# Patient Record
Sex: Female | Born: 2000 | Race: White | Hispanic: No | Marital: Single | Smoking: Never smoker
Health system: Southern US, Community
[De-identification: ages and names within clinical notes are randomized; demographics above are authoritative.]

---

## 2018-01-23 ENCOUNTER — Encounter: Payer: Self-pay | Admitting: Emergency Medicine

## 2018-01-23 ENCOUNTER — Other Ambulatory Visit: Payer: Self-pay

## 2018-01-23 ENCOUNTER — Emergency Department
Admission: EM | Admit: 2018-01-23 | Discharge: 2018-01-23 | Disposition: A | Discharge status: Home / Self Care | Payer: 59 | Attending: Emergency Medicine | Admitting: Emergency Medicine

## 2018-01-23 DIAGNOSIS — T7805XA Anaphylactic reaction due to tree nuts and seeds, initial encounter: Secondary | ICD-10-CM | POA: Insufficient documentation

## 2018-01-23 DIAGNOSIS — T782XXA Anaphylactic shock, unspecified, initial encounter: Secondary | ICD-10-CM

## 2018-01-23 DIAGNOSIS — T7840XA Allergy, unspecified, initial encounter: Secondary | ICD-10-CM | POA: Diagnosis present

## 2018-01-23 MED ORDER — PREDNISONE 20 MG PO TABS: 60.0000 mg | ORAL_TABLET | Freq: Every day | ORAL | 0 refills | Status: DC

## 2018-01-23 MED ORDER — DIPHENHYDRAMINE HCL 50 MG/ML IJ SOLN
25.0000 mg | Freq: Once | INTRAMUSCULAR | Status: AC
  Administered 2018-01-23: 25 mg via INTRAVENOUS
  Filled 2018-01-23: qty 1

## 2018-01-23 MED ORDER — EPINEPHRINE 0.3 MG/0.3ML IJ SOAJ: 0.3000 mg | Freq: Once | INTRAMUSCULAR | 0 refills | Status: AC

## 2018-01-23 MED ORDER — PREDNISONE 20 MG PO TABS
60.0000 mg | ORAL_TABLET | Freq: Once | ORAL | Status: AC
  Administered 2018-01-23: 60 mg via ORAL
  Filled 2018-01-23: qty 3

## 2018-01-23 MED ORDER — FAMOTIDINE IN NACL 20-0.9 MG/50ML-% IV SOLN
20.0000 mg | Freq: Once | INTRAVENOUS | Status: AC
  Administered 2018-01-23: 20 mg via INTRAVENOUS
  Filled 2018-01-23: qty 50

## 2018-01-23 MED ORDER — FAMOTIDINE 20 MG PO TABS: 20.0000 mg | ORAL_TABLET | Freq: Two times a day (BID) | ORAL | 0 refills | Status: AC

## 2018-01-23 NOTE — ED Provider Notes (Addendum)
[facility] Emergency Department Provider Note  ____________________________________________   I have reviewed the triage vital signs and the nursing notes. Where available I have reviewed prior notes and, if possible and indicated, outside hospital notes.    HISTORY  Chief Complaint Allergic Reaction    HPI Shannon Tran is a 17 y.o. 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  Did feel itching in her throat and tongue, and she used her EpiPen, and her symptoms have now resolved.  S she has used her EpiPen before.  It has happened at least once in the past where she had allergic reactions recur after the EpiPen.  She denies any symptoms at this time she states her tongue feels very slightly itchy but she has no angioedema she has no shortness of breath she has no wheeze or hives     History reviewed. No pertinent past medical history.  There are no active problems to display for this patient.   History reviewed. No pertinent surgical history.  Prior to Admission medications   Not on File    Allergies Other and Sesame seed (diagnostic)  No family history on file.  Social History Social History   Tobacco Use  . Smoking status: Not on file  Substance Use Topics  . Alcohol use: Not on file  . Drug use: Not on file    Review of Systems Constitutional: No fever/chills Eyes: No visual changes. ENT: No sore throat. No stiff neck no neck pain Cardiovascular: Denies chest pain. Respiratory: Denies shortness of breath. Gastrointestinal:   no vomiting.  No diarrhea.  No constipation. Genitourinary: Negative for dysuria. Musculoskeletal: Negative lower extremity swelling Skin: Negative for rash. Neurological: Negative for severe headaches, focal weakness or numbness.   ____________________________________________   PHYSICAL EXAM:  VITAL SIGNS: ED  Triage Vitals  Enc Vitals Group     BP 01/23/18 1259 119/60     Pulse Rate 01/23/18 1259 70     Resp 01/23/18 1259 18     Temp 01/23/18 1259 (!) 97.5 F (36.4 C)     Temp Source 01/23/18 1259 Oral     SpO2 01/23/18 1259 98 %     Weight 01/23/18 1300 130 lb (59 kg)     Height 01/23/18 1300 5\' 7"  (1.702 m)     Head Circumference --      Peak Flow --      Pain Score 01/23/18 1300 0     Pain Loc --      Pain Edu? --      Excl. in GC? --     Constitutional: Alert and oriented. Well appearing and in no acute distress. Eyes: Conjunctivae are normal Head: Atraumatic HEENT: No congestion/rhinnorhea. Mucous membranes are moist.  Oropharynx non-erythematous Neck:   Nontender with no meningismus, no masses, no stridor Cardiovascular: Normal rate, regular rhythm. Grossly normal heart sounds.  Good peripheral circulation. Respiratory: Normal respiratory effort.  No retractions. Lungs CTAB. Abdominal: Soft and nontender. No distention. No guarding no rebound Back:  There is no focal tenderness or step off.  there is no midline tenderness there are no lesions noted. there is no CVA tenderness Musculoskeletal: No lower extremity tenderness, no upper extremity tenderness. No joint effusions, no DVT signs strong distal pulses no edema Neurologic:  Normal speech and language. No gross focal neurologic deficits are appreciated.  Skin:  Skin is warm,  dry and intact. No rash noted. Psychiatric: Mood and affect are normal. Speech and behavior are normal.  ____________________________________________   LABS (all labs ordered are listed, but only abnormal results are displayed)  Labs Reviewed - No data to display  Pertinent labs  results that were available during my care of the patient were reviewed by me and considered in my medical decision making (see chart for details). ____________________________________________  EKG  I personally interpreted any EKGs ordered by me or  triage  ____________________________________________  RADIOLOGY  Pertinent labs & imaging results that were available during my care of the patient were reviewed by me and considered in my medical decision making (see chart for details). If possible, patient and/or family made aware of any abnormal findings.  No results found. ____________________________________________    PROCEDURES  Procedure(s) performed: None  Procedures  Critical Care performed: None  ____________________________________________   INITIAL IMPRESSION / ASSESSMENT AND PLAN / ED COURSE  Pertinent labs & imaging results that were available during my care of the patient were reviewed by me and considered in my medical decision making (see chart for details).  Patient had an allergic reaction to sesame seed, took an EpiPen and now feels better.  There is no evidence of angioedema to the lips tongue uvula or posterior pharynx, her throat is normal to her subjective assessment and she has no evidence of throat swelling or stridor at this time.  Lungs are clear, no evidence of hives.  She is appears to be almost completely resolved after her EpiPen we will nonetheless give her Benadryl, prednisone, and Pepcid we will keep her in the emergency room for several hours to ensure that she does not have a recurrence of her allergic symptom allergy after the depletion of her epinephrine.  ----------------------------------------- 3:23 PM on 01/23/2018 ----------------------------------------- Signed out to Dr. Don Perking at the end of my shift     ____________________________________________   FINAL CLINICAL IMPRESSION(S) / ED DIAGNOSES  Final diagnoses:  None      This chart was dictated using voice recognition software.  Despite best efforts to proofread,  errors can occur which can change meaning.      Jeanmarie Plant, MD 01/23/18 1416    Jeanmarie Plant, MD 01/23/18 1535

## 2018-01-23 NOTE — Discharge Instructions (Addendum)
You did an excellent job using your EpiPen today.  Keep it with you at all times.  If you have a recurrence of symptoms that are significant such as you had today, use the EpiPen and call 911.  If you have a mild reaction such as isolated mild hives, take Benadryl and if it does not resolve return to the emergency room.  If you have any trouble breathing call 911 immediately and use your EpiPen.

## 2018-01-23 NOTE — ED Provider Notes (Signed)
-----------------------------------------   4:56 PM on 01/23/2018 -----------------------------------------   Blood pressure 104/65, pulse 74, temperature (!) 97.5 F (36.4 C), temperature source Oral, resp. rate 18, height 5\' 7"  (1.702 m), weight 59 kg, last menstrual period 01/09/2018, SpO2 98 %.  Assuming care from Dr. Alphonzo Lemmings of Shannon Tran is a 17 y.o. female with a chief complaint of Allergic Reaction .    Please refer to H&P by previous MD for further details.  Patient was observed for 4 hours in the emergency department with no recurrence of her anaphylactic reaction.  Patient looks extremely well-appearing at this time, eating chips and talking and laughing with a friend.  She does have epi-pens at home in case her symptoms recur.  We reinforced the importance of using the EpiPen for anaphylactic symptoms.  She will be discharged home with prescriptions and instructions left by Dr. Alphonzo Lemmings.Don Perking, [place], MD 01/23/18 [phone]

## 2018-01-23 NOTE — ED Notes (Signed)
E signature pad not working on [employer]. Pt verbalizes understanding of discharge and follow up instructions as well as prescriptions.

## 2018-01-23 NOTE — ED Triage Notes (Signed)
States ate something with sesame seeds approx 1215, is allergic. Took epi pen at 1230 due to feeling like throat closing no resp diatress at present.

## 2018-01-23 NOTE — ED Notes (Signed)
Report given to Lorrie RN 

## 2018-02-25 ENCOUNTER — Encounter: Payer: Self-pay | Admitting: Emergency Medicine

## 2018-02-25 ENCOUNTER — Emergency Department: Payer: 59

## 2018-02-25 ENCOUNTER — Other Ambulatory Visit: Payer: Self-pay

## 2018-02-25 ENCOUNTER — Emergency Department
Admission: EM | Admit: 2018-02-25 | Discharge: 2018-02-25 | Disposition: A | Discharge status: Home / Self Care | Payer: 59 | Attending: Emergency Medicine | Admitting: Emergency Medicine

## 2018-02-25 DIAGNOSIS — R55 Syncope and collapse: Secondary | ICD-10-CM

## 2018-02-25 LAB — BASIC METABOLIC PANEL
Anion gap: 8 (ref 5–15)
BUN: 10 mg/dL (ref 6–20)
CO2: 27 mmol/L (ref 22–32)
CREATININE: 0.75 mg/dL (ref 0.44–1.00)
Calcium: 9.6 mg/dL (ref 8.9–10.3)
Chloride: 103 mmol/L (ref 98–111)
GFR calc Af Amer: >60 mL/min (ref >60)
GLUCOSE: 95 mg/dL (ref 70–99)
Potassium: 3.9 mmol/L (ref 3.5–5.1)
SODIUM: 138 mmol/L (ref 135–145)

## 2018-02-25 LAB — URINALYSIS, COMPLETE (UACMP) WITH MICROSCOPIC
BACTERIA UA: NONE SEEN
Bilirubin Urine: NEGATIVE
Glucose, UA: NEGATIVE mg/dL
HGB URINE DIPSTICK: NEGATIVE
Ketones, ur: NEGATIVE mg/dL
Leukocytes, UA: NEGATIVE
NITRITE: NEGATIVE
PH: 7 (ref 5.0–8.0)
Protein, ur: NEGATIVE mg/dL
SPECIFIC GRAVITY, URINE: 1.003 — AB (ref 1.005–1.030)

## 2018-02-25 LAB — CBC
HCT: 39.4 % (ref 36.0–46.0)
Hemoglobin: 13.1 g/dL (ref 12.0–15.0)
MCH: 30.8 pg (ref 26.0–34.0)
MCHC: 33.2 g/dL (ref 30.0–36.0)
MCV: 92.7 fL (ref 80.0–100.0)
Platelets: 313 10*3/uL (ref 150–400)
RBC: 4.25 MIL/uL (ref 3.87–5.11)
RDW: 12.2 % (ref 11.5–15.5)
WBC: 8.1 10*3/uL (ref 4.0–10.5)
nRBC: 0 % (ref 0.0–0.2)

## 2018-02-25 LAB — PREGNANCY, URINE: PREG TEST UR: NEGATIVE

## 2018-02-25 LAB — TROPONIN I: Troponin I: <0.03 ng/mL (ref <0.03)

## 2018-02-25 MED ORDER — SODIUM CHLORIDE 0.9 % IV SOLN
Freq: Once | INTRAVENOUS | Status: AC
  Administered 2018-02-25: 21:00:00 via INTRAVENOUS

## 2018-02-25 MED ORDER — SODIUM CHLORIDE 0.9 % IV SOLN
Freq: Once | INTRAVENOUS | Status: AC
  Administered 2018-02-25: 22:00:00 via INTRAVENOUS

## 2018-02-25 NOTE — ED Notes (Signed)
Patient states that she feels like she is going to pass out. Patient became diaphoretic. Blood pressure 71/49 and heart rate 109.

## 2018-02-25 NOTE — ED Provider Notes (Addendum)
[facility] Emergency Department Provider Note       Time seen: ----------------------------------------- 8:05 PM on 02/25/2018 -----------------------------------------   I have reviewed the triage vital signs and the nursing notes.  HISTORY   Chief Complaint Loss of Consciousness    HPI Shannon Tran is a 17 y.o. female with no significant past medical history who presents to the ED for syncope.  Patient states she feels like she can a pass out, she became diaphoretic, heart rate increased and her blood pressure dropped.  She is a Landscape architect.  Patient has no history of syncope in the past, is currently on amoxicillin for a sore throat but also tested positive for mono.  History reviewed. No pertinent past medical history.  There are no active problems to display for this patient.   History reviewed. No pertinent surgical history.  Allergies Other and Sesame seed (diagnostic)  Social History Social History   Tobacco Use  . Smoking status: Never Smoker  . Smokeless tobacco: Never Used  Substance Use Topics  . Alcohol use: Yes  . Drug use: Never   Review of Systems Constitutional: Negative for fever. ENT: Positive for sore throat Cardiovascular: Negative for chest pain. Respiratory: Positive for shortness of breath Gastrointestinal: Negative for abdominal pain, vomiting and diarrhea. Musculoskeletal: Negative for back pain. Skin: Negative for rash. Neurological: Negative for headaches, positive for weakness  All systems negative/normal/unremarkable except as stated in the HPI  ____________________________________________   PHYSICAL EXAM:  VITAL SIGNS: ED Triage Vitals  Enc Vitals Group     BP 02/25/18 1956 114/86     Pulse Rate 02/25/18 1956 83     Resp 02/25/18 1956 18     Temp 02/25/18 1956 98.4 F (36.9 C)     Temp Source 02/25/18 1956 Oral     SpO2 02/25/18 1956 98 %     Weight 02/25/18 1952 130 lb (59 kg)     Height  02/25/18 1952 5\' 7"  (1.702 m)     Head Circumference --      Peak Flow --      Pain Score 02/25/18 1952 0     Pain Loc --      Pain Edu? --      Excl. in GC? --    Constitutional: Alert and oriented. Well appearing and in no distress. Eyes: Conjunctivae are normal. Normal extraocular movements. ENT   Head: Normocephalic and atraumatic.   Nose: No congestion/rhinnorhea.   Mouth/Throat: Mucous membranes are moist.   Neck: No stridor. Cardiovascular: Normal rate, regular rhythm. No murmurs, rubs, or gallops. Respiratory: Normal respiratory effort without tachypnea nor retractions. Breath sounds are clear and equal bilaterally. No wheezes/rales/rhonchi. Gastrointestinal: Soft and nontender. Normal bowel sounds Musculoskeletal: Nontender with normal range of motion in extremities. No lower extremity tenderness nor edema. Neurologic:  Normal speech and language. No gross focal neurologic deficits are appreciated.  Skin:  Skin is warm, dry and intact. No rash noted. Psychiatric: Mood and affect are normal. Speech and behavior are normal.  ____________________________________________  EKG: Interpreted by me.  Sinus rhythm with a rate of 73 bpm, low voltage QRS, normal axis, normal QT  ____________________________________________  ED COURSE:  As part of my medical decision making, I reviewed the following data within the electronic MEDICAL RECORD NUMBER History obtained from family if available, nursing notes, old chart and ekg, as well as notes from prior ED visits. Patient presented for recurrent episodes of syncope, we will assess with labs and imaging  as indicated at this time.   Procedures ____________________________________________   LABS (pertinent positives/negatives)  Labs Reviewed  URINALYSIS, COMPLETE (UACMP) WITH MICROSCOPIC - Abnormal; Notable for the following components:      Result Value   Color, Urine STRAW (*)    APPearance CLEAR (*)    Specific Gravity,  Urine 1.003 (*)    All other components within normal limits  BASIC METABOLIC PANEL  CBC  TROPONIN I  PREGNANCY, URINE  CBG MONITORING, ED  POC URINE PREG, ED    RADIOLOGY Images were viewed by me  Chest x-ray Is unremarkable ____________________________________________  DIFFERENTIAL DIAGNOSIS   Dehydration, electrolyte abnormality, arrhythmia, orthostatic hypotension, mono  FINAL ASSESSMENT AND PLAN  Syncope   Plan: The patient had presented for syncope and was found to be hypotensive here. Patient's labs are unremarkable. Patient's imaging is also unremarkable.  She appeared to have a vasovagal type near syncopal event when her blood was being drawn but otherwise has been normal.  We gave her 2 L of fluid and she has not had orthostatic changes upon standing after that.  She still states she feels dizzy and I am unclear as to why this would be, she did test positive for mono so that may be why she feels poorly.  She is also currently taking amoxicillin but I am unclear as to why.  She was negative for mono and strep.  I will advise that she stop her amoxicillin.   Ulice Dash, MD   Note: This note was generated in part or whole with voice recognition software. Voice recognition is usually quite accurate but there are transcription errors that can and very often do occur. I apologize for any typographical errors that were not detected and corrected.     Emily Filbert, MD 02/25/18 2316    Emily Filbert, MD 02/25/18 [phone]

## 2018-02-25 NOTE — ED Notes (Signed)
Patient ambulated without difficulty to bathroom

## 2018-02-25 NOTE — ED Notes (Signed)
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

## 2018-02-25 NOTE — ED Notes (Signed)
Patient verbalized understanding of discharge instructions, no questions. Patient ambulated out of ED with steady gait in no distress.  

## 2018-02-25 NOTE — ED Triage Notes (Signed)
Patient states that she has felt dizzy times 24 hours. Patient states that she has had 4 syncopal episodes today.

## 2018-02-25 NOTE — ED Notes (Signed)
Upon ambulation down hall, patient reports dizziness. Dr. Mayford Knife aware.

## 2018-02-25 NOTE — ED Notes (Signed)
POC preg negative.

## 2019-02-08 ENCOUNTER — Other Ambulatory Visit: Payer: Self-pay

## 2019-02-08 DIAGNOSIS — Z20822 Contact with and (suspected) exposure to covid-19: Secondary | ICD-10-CM

## 2019-02-10 LAB — NOVEL CORONAVIRUS, NAA: SARS-CoV-2, NAA: NOT DETECTED

## 2019-02-20 ENCOUNTER — Other Ambulatory Visit: Payer: Self-pay

## 2019-02-20 DIAGNOSIS — Z20822 Contact with and (suspected) exposure to covid-19: Secondary | ICD-10-CM

## 2019-02-22 LAB — NOVEL CORONAVIRUS, NAA: SARS-CoV-2, NAA: NOT DETECTED

## 2019-03-07 ENCOUNTER — Encounter: Payer: Self-pay | Admitting: *Deleted

## 2019-03-07 ENCOUNTER — Ambulatory Visit
Admission: EM | Admit: 2019-03-07 | Discharge: 2019-03-07 | Disposition: A | Discharge status: Home / Self Care | Payer: BC Managed Care – PPO | Attending: Emergency Medicine | Admitting: Emergency Medicine

## 2019-03-07 DIAGNOSIS — J039 Acute tonsillitis, unspecified: Secondary | ICD-10-CM | POA: Insufficient documentation

## 2019-03-07 LAB — POCT RAPID STREP A (OFFICE): Rapid Strep A Screen: NEGATIVE

## 2019-03-07 MED ORDER — AMOXICILLIN 500 MG PO TABS: 500.0000 mg | ORAL_TABLET | Freq: Three times a day (TID) | ORAL | 0 refills | Status: AC

## 2019-03-07 NOTE — ED Triage Notes (Signed)
Patient reports sore throat since Friday, with increase swelling and "white spots" now on throat since yesterday. No fever or body aches.

## 2019-03-07 NOTE — ED Provider Notes (Signed)
[facility]    CSN: [id number] Arrival date & time: 03/07/19  1133      History   Chief Complaint Chief Complaint  Patient presents with  . Sore Throat    HPI Shannon Tran is a 18 y.o. female.   Patient presents with sore throat times next days.  She also reports "white spots" on her throat x1 day.  She denies fever, chills, rash, difficulty swallowing, cough, shortness of breath, or other symptoms.  LMP: 03/05/2019.  Treatment attempted at home with Tylenol and ibuprofen.  The history is provided by the patient.    History reviewed. No pertinent past medical history.  There are no active problems to display for this patient.   History reviewed. No pertinent surgical history.  OB History   No obstetric history on file.      Home Medications    Prior to Admission medications   Medication Sig Start Date End Date Taking? Authorizing Provider  amoxicillin (AMOXIL) 500 MG tablet Take 1 tablet (500 mg total) by mouth 3 (three) times daily. 03/07/19   Sharion Balloon, NP  famotidine (PEPCID) 20 MG tablet Take 1 tablet (20 mg total) by mouth 2 (two) times daily for 5 days. 01/23/18 01/28/18  Schuyler Amor, MD  predniSONE (DELTASONE) 20 MG tablet Take 3 tablets (60 mg total) by mouth daily. 01/23/18   Schuyler Amor, MD    Family History History reviewed. No pertinent family history.  Social History Social History   Tobacco Use  . Smoking status: Never Smoker  . Smokeless tobacco: Never Used  Substance Use Topics  . Alcohol use: Yes    Comment: occ  . Drug use: Never     Allergies   Other and Sesame seed (diagnostic)   Review of Systems Review of Systems  Constitutional: Negative for chills and fever.  HENT: Positive for sore throat. Negative for congestion, ear pain, rhinorrhea and trouble swallowing.   Eyes: Negative for pain and visual disturbance.  Respiratory: Negative for cough and shortness of breath.   Cardiovascular: Negative for chest  pain and palpitations.  Gastrointestinal: Negative for abdominal pain, diarrhea and vomiting.  Genitourinary: Negative for dysuria and hematuria.  Musculoskeletal: Negative for arthralgias and back pain.  Skin: Negative for color change and rash.  Neurological: Negative for seizures and syncope.  All other systems reviewed and are negative.    Physical Exam Triage Vital Signs ED Triage Vitals  Enc Vitals Group     BP 03/07/19 1136 113/76     Pulse Rate 03/07/19 1136 (!) 53     Resp 03/07/19 1136 16     Temp 03/07/19 1136 97.9 F (36.6 C)     Temp Source 03/07/19 1136 Oral     SpO2 03/07/19 1136 99 %     Weight --      Height --      Head Circumference --      Peak Flow --      Pain Score 03/07/19 1134 8     Pain Loc --      Pain Edu? --      Excl. in [place]? --    No data found.  Updated Vital Signs BP 113/76 (BP Location: Left Arm)   Pulse (!) 53   Temp 97.9 F (36.6 C) (Oral)   Resp 16   LMP 03/05/2019   SpO2 99%   Visual Acuity Right Eye Distance:   Left Eye Distance:   Bilateral Distance:  Right Eye Near:   Left Eye Near:    Bilateral Near:     Physical Exam Vitals signs and nursing note reviewed.  Constitutional:      General: She is not in acute distress.    Appearance: She is well-developed.  HENT:     Head: Normocephalic and atraumatic.     Right Ear: Tympanic membrane normal.     Left Ear: Tympanic membrane normal.     Nose: Nose normal.     Mouth/Throat:     Mouth: Mucous membranes are moist.     Pharynx: Uvula midline. Posterior oropharyngeal erythema present.     Tonsils: 2+ on the right. 2+ on the left.  Eyes:     Conjunctiva/sclera: Conjunctivae normal.  Neck:     Musculoskeletal: Neck supple.  Cardiovascular:     Rate and Rhythm: Normal rate and regular rhythm.     Heart sounds: No murmur.  Pulmonary:     Effort: Pulmonary effort is normal. No respiratory distress.     Breath sounds: Normal breath sounds.  Abdominal:     General:  Bowel sounds are normal.     Palpations: Abdomen is soft.     Tenderness: There is no abdominal tenderness. There is no guarding or rebound.  Skin:    General: Skin is warm and dry.     Findings: No rash.  Neurological:     Mental Status: She is alert.      UC Treatments / Results  Labs (all labs ordered are listed, but only abnormal results are displayed) Labs Reviewed  POCT RAPID STREP A (OFFICE)    EKG   Radiology No results found.  Procedures Procedures (including critical care time)  Medications Ordered in UC Medications - No data to display  Initial Impression / Assessment and Plan / UC Course  I have reviewed the triage vital signs and the nursing notes.  Pertinent labs & imaging results that were available during my care of the patient were reviewed by me and considered in my medical decision making (see chart for details).    Acute tonsillitis.  Treating with amoxicillin.  Instructed patient to take Tylenol or ibuprofen as needed for discomfort.  Instructed her to return here or follow-up with her PCP if her symptoms are not improving.  Patient agrees to plan of care.     Final Clinical Impressions(s) / UC Diagnoses   Final diagnoses:  Acute tonsillitis, unspecified etiology     Discharge Instructions     Take the amoxicillin 3 times a day for 7 days.    Take Tylenol or ibuprofen as needed for discomfort.   Follow-up with your primary care provider or return here if your symptoms or not improving.        ED Prescriptions    Medication Sig Dispense Auth. Provider   amoxicillin (AMOXIL) 500 MG tablet Take 1 tablet (500 mg total) by mouth 3 (three) times daily. 21 tablet Mickie Bail, NP     PDMP not reviewed this encounter.   Mickie Bail, NP 03/07/19 [phone]

## 2019-03-07 NOTE — Discharge Instructions (Addendum)
Take the amoxicillin 3 times a day for 7 days.    Take Tylenol or ibuprofen as needed for discomfort.   Follow-up with your primary care provider or return here if your symptoms or not improving.

## 2019-03-10 LAB — CULTURE, GROUP A STREP (THRC)

## 2019-06-13 ENCOUNTER — Other Ambulatory Visit: Payer: BC Managed Care – PPO

## 2019-08-25 ENCOUNTER — Encounter: Payer: Self-pay | Admitting: Emergency Medicine

## 2019-08-25 ENCOUNTER — Other Ambulatory Visit: Payer: Self-pay

## 2019-08-25 DIAGNOSIS — Z5321 Procedure and treatment not carried out due to patient leaving prior to being seen by health care provider: Secondary | ICD-10-CM | POA: Diagnosis not present

## 2019-08-25 DIAGNOSIS — F10920 Alcohol use, unspecified with intoxication, uncomplicated: Secondary | ICD-10-CM | POA: Insufficient documentation

## 2019-08-25 LAB — COMPREHENSIVE METABOLIC PANEL
ALT: 17 U/L (ref 0–44)
AST: 25 U/L (ref 15–41)
Albumin: 5 g/dL (ref 3.5–5.0)
Alkaline Phosphatase: 94 U/L (ref 38–126)
Anion gap: 8 (ref 5–15)
BUN: 10 mg/dL (ref 6–20)
CO2: 25 mmol/L (ref 22–32)
Calcium: 9 mg/dL (ref 8.9–10.3)
Chloride: 110 mmol/L (ref 98–111)
Creatinine, Ser: 0.68 mg/dL (ref 0.44–1.00)
GFR calc Af Amer: >60 mL/min (ref >60)
GFR calc non Af Amer: >60 mL/min (ref >60)
Glucose, Bld: 93 mg/dL (ref 70–99)
Potassium: 3.8 mmol/L (ref 3.5–5.1)
Sodium: 143 mmol/L (ref 135–145)
Total Bilirubin: 0.7 mg/dL (ref 0.3–1.2)
Total Protein: 8.1 g/dL (ref 6.5–8.1)

## 2019-08-25 LAB — CBC
HCT: 38 % (ref 36.0–46.0)
Hemoglobin: 13 g/dL (ref 12.0–15.0)
MCH: 31.4 pg (ref 26.0–34.0)
MCHC: 34.2 g/dL (ref 30.0–36.0)
MCV: 91.8 fL (ref 80.0–100.0)
Platelets: 306 10*3/uL (ref 150–400)
RBC: 4.14 MIL/uL (ref 3.87–5.11)
RDW: 11.7 % (ref 11.5–15.5)
WBC: 8.3 10*3/uL (ref 4.0–10.5)
nRBC: 0 % (ref 0.0–0.2)

## 2019-08-25 LAB — ETHANOL: Alcohol, Ethyl (B): 285 mg/dL — ABNORMAL HIGH (ref <10)

## 2019-08-25 NOTE — ED Triage Notes (Signed)
Pt arrives POV to triage with c/o being intoxicated. Pt has no idea how much she drank but was found sleeping on the sidewalk and not answering her friends. Pt laid herself down and has had no injury or trauma. Pt is giggling in triage at this time and is in NAD.

## 2019-08-25 NOTE — ED Notes (Signed)
Patient observed walking out with friend by registration clerk.

## 2019-08-26 ENCOUNTER — Emergency Department
Admission: EM | Admit: 2019-08-26 | Discharge: 2019-08-26 | Disposition: A | Discharge status: Home / Self Care | Payer: BC Managed Care – PPO | Attending: Emergency Medicine | Admitting: Emergency Medicine

## 2019-08-27 ENCOUNTER — Telehealth: Payer: Self-pay | Admitting: Emergency Medicine

## 2019-08-27 NOTE — Telephone Encounter (Signed)
Called patient due to lwot to inquire about condition and follow up plans. Left message.   

## 2019-11-14 IMAGING — CR DG CHEST 2V
1 series · 3 of 3 positions shown · non-contrast
Comparison: None.

CLINICAL DATA: Shortness of breath

EXAM:
CHEST - 2 VIEW

[Series 1: dg chest 2 view · 0.14mm/px · 3 of 3 slices shown]
[im 1/3]
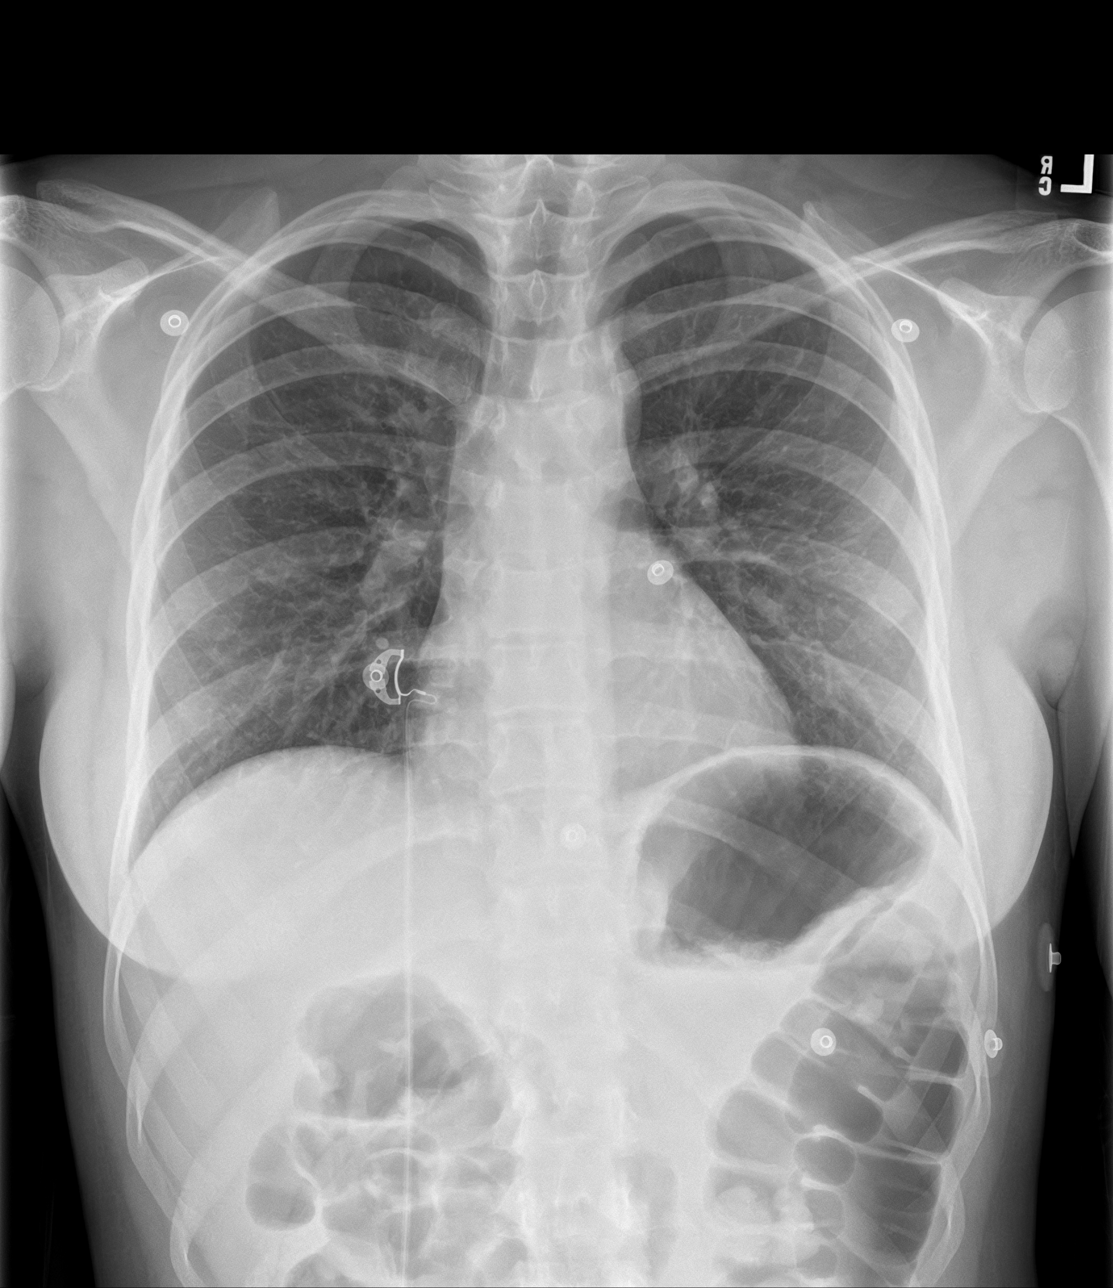
[im 2/3]
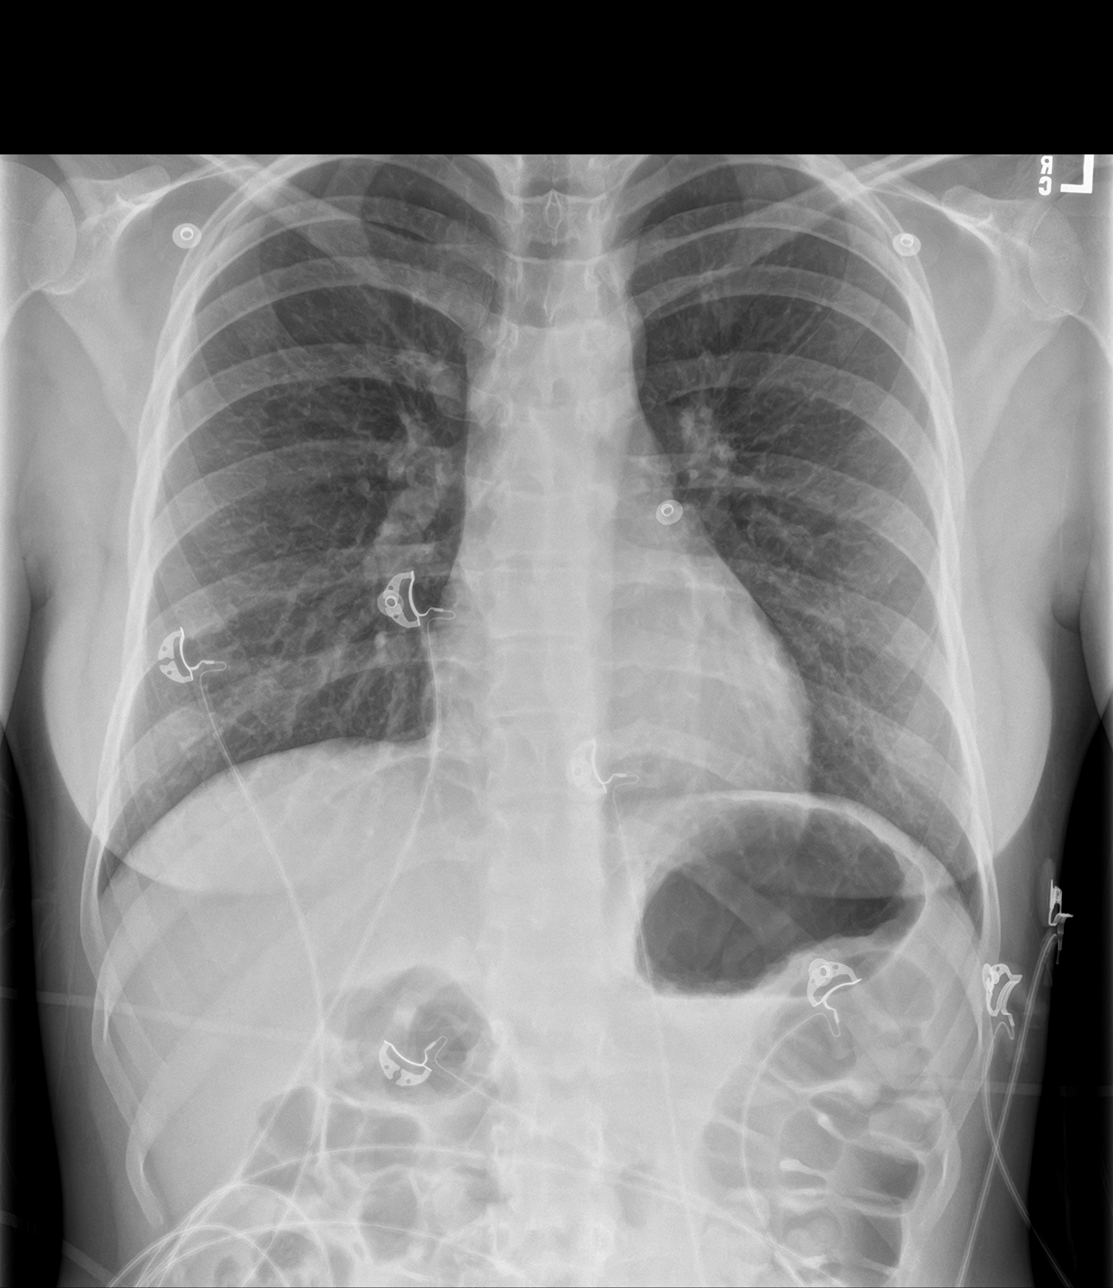
[im 3/3]
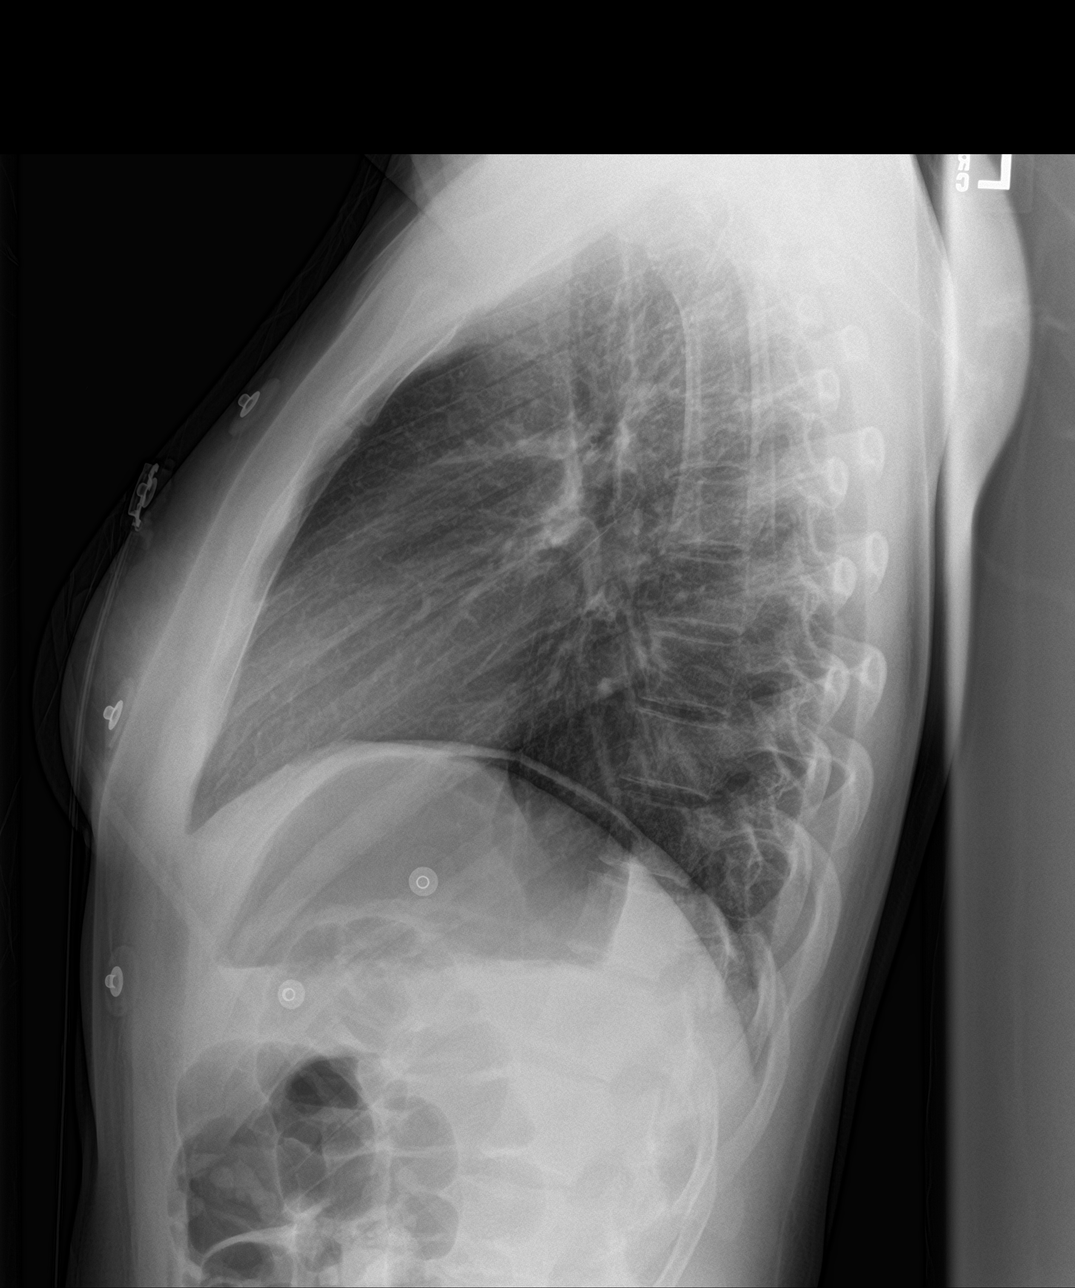

[3 of 3 positions shown; findings below may reference images not displayed]

FINDINGS: The heart size and mediastinal contours are within normal limits.
Both lungs are clear. The visualized skeletal structures are
unremarkable.
IMPRESSION: No active cardiopulmonary disease.

## 2021-05-05 ENCOUNTER — Other Ambulatory Visit: Payer: Self-pay

## 2021-05-05 ENCOUNTER — Other Ambulatory Visit (HOSPITAL_BASED_OUTPATIENT_CLINIC_OR_DEPARTMENT_OTHER): Payer: Self-pay

## 2021-05-05 ENCOUNTER — Encounter (HOSPITAL_BASED_OUTPATIENT_CLINIC_OR_DEPARTMENT_OTHER): Payer: Self-pay

## 2021-05-05 ENCOUNTER — Emergency Department (HOSPITAL_BASED_OUTPATIENT_CLINIC_OR_DEPARTMENT_OTHER)
Admission: EM | Admit: 2021-05-05 | Discharge: 2021-05-05 | Disposition: A | Discharge status: Home / Self Care | Payer: Commercial Managed Care - PPO | Attending: Emergency Medicine | Admitting: Emergency Medicine

## 2021-05-05 DIAGNOSIS — T7840XA Allergy, unspecified, initial encounter: Secondary | ICD-10-CM | POA: Diagnosis not present

## 2021-05-05 MED ORDER — PREDNISONE 20 MG PO TABS
40.0000 mg | ORAL_TABLET | Freq: Every day | ORAL | 0 refills | Status: AC
  Filled 2021-05-05: qty 4, 2d supply, fill #0

## 2021-05-05 MED ORDER — EPINEPHRINE 0.3 MG/0.3ML IJ SOAJ
0.3000 mg | INTRAMUSCULAR | 0 refills | Status: AC | PRN
  Filled 2021-05-05: qty 2, 2d supply, fill #0

## 2021-05-05 MED ORDER — DIPHENHYDRAMINE HCL 25 MG PO CAPS
25.0000 mg | ORAL_CAPSULE | Freq: Once | ORAL | Status: AC
  Administered 2021-05-05: 25 mg via ORAL
  Filled 2021-05-05: qty 1

## 2021-05-05 MED ORDER — PREDNISONE 50 MG PO TABS
60.0000 mg | ORAL_TABLET | Freq: Once | ORAL | Status: AC
  Administered 2021-05-05: 60 mg via ORAL
  Filled 2021-05-05: qty 1

## 2021-05-05 NOTE — ED Provider Notes (Signed)
[facility] EMERGENCY DEPARTMENT Provider Note   CSN: [id number] Arrival date & time: 05/05/21  1236     History  Chief Complaint  Patient presents with   Allergic Reaction    Shannon Tran is a 21 y.o. female.  Patient took EpiPen about 25 minutes prior to arrival after eating which she thinks was sesame seeds.  She started having some itchiness in her throat and took her EpiPen.  Took children's Benadryl.  History of allergies and sesame seeds..  Overall she is asymptomatic now  The history is provided by the patient.  Allergic Reaction Presenting symptoms: itching   Severity:  Mild Duration:  30 minutes Prior allergic episodes:  Food/nut allergies Context: food   Relieved by:  Epinephrine and antihistamines Worsened by:  Nothing     Home Medications Prior to Admission medications   Medication Sig Start Date End Date Taking? Authorizing Provider  EPINEPHrine 0.3 mg/0.3 mL IJ SOAJ injection Inject 0.3 mg into the muscle as needed for anaphylaxis. 05/05/21  Yes Namiko Pritts, DO  predniSONE (DELTASONE) 20 MG tablet Take 2 tablets (40 mg total) by mouth daily for 2 days. 05/05/21 05/07/21 Yes Antavious Spanos, DO  amoxicillin (AMOXIL) 500 MG tablet Take 1 tablet (500 mg total) by mouth 3 (three) times daily. 03/07/19   Mickie Bail, NP  famotidine (PEPCID) 20 MG tablet Take 1 tablet (20 mg total) by mouth 2 (two) times daily for 5 days. 01/23/18 01/28/18  Jeanmarie Plant, MD      Allergies    Other and Sesame seed (diagnostic)    Review of Systems   Review of Systems  Skin:  Positive for itching.   Physical Exam Updated Vital Signs BP 110/76    Pulse (!) 52    Temp 97.9 F (36.6 C) (Oral)    Resp 20    Ht 5\' 7"  (1.702 m)    Wt 61.2 kg    SpO2 100%    BMI 21.14 kg/m  Physical Exam Vitals and nursing note reviewed.  Constitutional:      General: She is not in acute distress.    Appearance: She is well-developed. She is not ill-appearing.  HENT:     Head:  Normocephalic and atraumatic.     Nose: Nose normal.     Mouth/Throat:     Mouth: Mucous membranes are moist.  Eyes:     Extraocular Movements: Extraocular movements intact.     Conjunctiva/sclera: Conjunctivae normal.     Pupils: Pupils are equal, round, and reactive to light.  Cardiovascular:     Rate and Rhythm: Normal rate and regular rhythm.     Pulses: Normal pulses.     Heart sounds: Normal heart sounds. No murmur heard. Pulmonary:     Effort: Pulmonary effort is normal. No respiratory distress.     Breath sounds: Normal breath sounds.  Abdominal:     Palpations: Abdomen is soft.     Tenderness: There is no abdominal tenderness.  Musculoskeletal:        General: No swelling.     Cervical back: Normal range of motion and neck supple.  Skin:    General: Skin is warm and dry.     Capillary Refill: Capillary refill takes less than 2 seconds.  Neurological:     Mental Status: She is alert.  Psychiatric:        Mood and Affect: Mood normal.    ED Results / Procedures / Treatments   Labs (all  labs ordered are listed, but only abnormal results are displayed) Labs Reviewed - No data to display  EKG EKG Interpretation  Date/Time:  Tuesday May 05 2021 12:50:04 EST Ventricular Rate:  61 PR Interval:  162 QRS Duration: 118 QT Interval:  434 QTC Calculation: 438 R Axis:   9 Text Interpretation: Sinus arrhythmia Confirmed by Virgina Norfolk ([id number]) on 05/05/2021 12:51:53 PM  Radiology No results found.  Procedures Procedures    Medications Ordered in ED Medications  predniSONE (DELTASONE) tablet 60 mg (60 mg Oral Given 05/05/21 1259)  diphenhydrAMINE (BENADRYL) capsule 25 mg (25 mg Oral Given 05/05/21 1259)    ED Course/ Medical Decision Making/ A&P                           Medical Decision Making  Shannon Tran is here after allergic reaction.  Unremarkable vitals.  No fever.  No signs of anaphylaxis on exam.  She is not having any wheezing or shortness of  breath.  No hives.  She has no swelling of her tongue or lips.  Oropharynx is clear.  She accidentally ate some sesame seeds.  Was having some itchiness in her throat and took her EpiPen.  Asymptomatic now.  Will give dose of Benadryl and prednisone and observe.  2:56 PM patient reevaluated and still asymptomatic.  At this time think that she is clear for discharge.  She still has an extra EpiPen to use as needed.  She feels comfortable with how she feels and with EpiPen use.  I have prescribed her additional EpiPen's as well as prednisone.  Educated about Benadryl use.  Discharged in good condition.  This chart was dictated using voice recognition software.  Despite best efforts to proofread,  errors can occur which can change the documentation meaning.         Final Clinical Impression(s) / ED Diagnoses Final diagnoses:  Allergic reaction, initial encounter    Rx / DC Orders ED Discharge Orders          Ordered    predniSONE (DELTASONE) 20 MG tablet  Daily        05/05/21 1344    EPINEPHrine 0.3 mg/0.3 mL IJ SOAJ injection  As needed        05/05/21 1344              Tevion Laforge, Madelaine Bhat, DO 05/05/21 1456

## 2021-05-05 NOTE — ED Triage Notes (Addendum)
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 Ate chicken salad from a restaurant. Used Epi pen 20 mins pta. States mouth felt itchy and throat became tight. Also took 20mg  childrens benadryl pta.

## 2021-05-05 NOTE — Discharge Instructions (Signed)
Continue using 25 mg of Benadryl every 4-6 hours as needed.  Take prednisone for the next 2 days.  You have already been given your first dose of steroid today.  I have sent the new prescription for EpiPen.

## 2021-05-05 NOTE — ED Notes (Signed)
Appears comfortable, no change in resp status, remains WNL. ED MD in to re-evaluate pt

## 2021-05-05 NOTE — ED Notes (Signed)
Is allergic to Sesame seeds, she thinks she ate some in a salad, throat itching and tightening, took benadryl PO and used EPI pen prior to arrival. Speech wnl, swallowing WNL, Breath sounds and tracheal sounds clear. Placed on cont cardiac monitoring with cont POX. IV established.

## 2021-05-12 ENCOUNTER — Other Ambulatory Visit (HOSPITAL_BASED_OUTPATIENT_CLINIC_OR_DEPARTMENT_OTHER): Payer: Self-pay

## 2021-05-23 ENCOUNTER — Encounter: Payer: Self-pay | Admitting: Emergency Medicine

## 2021-05-23 ENCOUNTER — Other Ambulatory Visit: Payer: Self-pay

## 2021-05-23 ENCOUNTER — Emergency Department
Admission: EM | Admit: 2021-05-23 | Discharge: 2021-05-23 | Disposition: A | Discharge status: Home / Self Care | Payer: Commercial Managed Care - PPO | Attending: Emergency Medicine | Admitting: Emergency Medicine

## 2021-05-23 DIAGNOSIS — E860 Dehydration: Secondary | ICD-10-CM | POA: Diagnosis not present

## 2021-05-23 DIAGNOSIS — R110 Nausea: Secondary | ICD-10-CM | POA: Diagnosis not present

## 2021-05-23 DIAGNOSIS — Z20822 Contact with and (suspected) exposure to covid-19: Secondary | ICD-10-CM | POA: Insufficient documentation

## 2021-05-23 DIAGNOSIS — J039 Acute tonsillitis, unspecified: Secondary | ICD-10-CM

## 2021-05-23 DIAGNOSIS — R531 Weakness: Secondary | ICD-10-CM | POA: Diagnosis present

## 2021-05-23 LAB — BASIC METABOLIC PANEL
Anion gap: 6 (ref 5–15)
BUN: 10 mg/dL (ref 6–20)
CO2: 23 mmol/L (ref 22–32)
Calcium: 9.1 mg/dL (ref 8.9–10.3)
Chloride: 106 mmol/L (ref 98–111)
Creatinine, Ser: 0.76 mg/dL (ref 0.44–1.00)
GFR, Estimated: >60 mL/min (ref >60)
Glucose, Bld: 110 mg/dL — ABNORMAL HIGH (ref 70–99)
Potassium: 3.8 mmol/L (ref 3.5–5.1)
Sodium: 135 mmol/L (ref 135–145)

## 2021-05-23 LAB — POC URINE PREG, ED: Preg Test, Ur: NEGATIVE

## 2021-05-23 LAB — CBC WITH DIFFERENTIAL/PLATELET
Abs Immature Granulocytes: 0.02 10*3/uL (ref 0.00–0.07)
Basophils Absolute: 0.1 10*3/uL (ref 0.0–0.1)
Basophils Relative: 1 %
Eosinophils Absolute: 0.2 10*3/uL (ref 0.0–0.5)
Eosinophils Relative: 3 %
HCT: 35.7 % — ABNORMAL LOW (ref 36.0–46.0)
Hemoglobin: 12.2 g/dL (ref 12.0–15.0)
Immature Granulocytes: 0 %
Lymphocytes Relative: 21 %
Lymphs Abs: 1.6 10*3/uL (ref 0.7–4.0)
MCH: 30.8 pg (ref 26.0–34.0)
MCHC: 34.2 g/dL (ref 30.0–36.0)
MCV: 90.2 fL (ref 80.0–100.0)
Monocytes Absolute: 0.8 10*3/uL (ref 0.1–1.0)
Monocytes Relative: 10 %
Neutro Abs: 5.1 10*3/uL (ref 1.7–7.7)
Neutrophils Relative %: 65 %
Platelets: 238 10*3/uL (ref 150–400)
RBC: 3.96 MIL/uL (ref 3.87–5.11)
RDW: 12.4 % (ref 11.5–15.5)
WBC: 7.7 10*3/uL (ref 4.0–10.5)
nRBC: 0 % (ref 0.0–0.2)

## 2021-05-23 LAB — RESP PANEL BY RT-PCR (FLU A&B, COVID) ARPGX2
Influenza A by PCR: NEGATIVE
Influenza B by PCR: NEGATIVE
SARS Coronavirus 2 by RT PCR: NEGATIVE

## 2021-05-23 LAB — GROUP A STREP BY PCR: Group A Strep by PCR: NOT DETECTED

## 2021-05-23 MED ORDER — LACTATED RINGERS IV BOLUS
1000.0000 mL | Freq: Once | INTRAVENOUS | Status: AC
  Administered 2021-05-23: 1000 mL via INTRAVENOUS

## 2021-05-23 MED ORDER — DEXAMETHASONE SODIUM PHOSPHATE 10 MG/ML IJ SOLN
10.0000 mg | Freq: Once | INTRAMUSCULAR | Status: AC
  Administered 2021-05-23: 10 mg via INTRAVENOUS
  Filled 2021-05-23: qty 1

## 2021-05-23 MED ORDER — ONDANSETRON 4 MG PO TBDP
4.0000 mg | ORAL_TABLET | Freq: Once | ORAL | Status: AC
  Administered 2021-05-23: 4 mg via ORAL
  Filled 2021-05-23: qty 1

## 2021-05-23 NOTE — ED Provider Notes (Signed)
[facility] Provider Note    Event Date/Time   First MD Initiated Contact with Patient 05/23/21 1559     (approximate)   History   Chief Complaint Dehydration   HPI Shannon Tran is a 21 y.o. female, no remarkable medical history, presents the emergency department for evaluation of weakness and nausea.  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  Reports ongoing nausea, decreased appetite, and feelings of fatigue.  She states that she feels like she needs IV fluids.  She additionally endorses sore throat.  She states that her friend is being treated for tonsillitis right now and believes that she may also have tonsillitis.  Denies chest pain, shortness of breath, urinary symptoms, abdominal pain, back pain, or headaches.  History Limitations: No limitations.      Physical Exam  Triage Vital Signs: ED Triage Vitals  Enc Vitals Group     BP 05/23/21 1422 115/79     Pulse Rate 05/23/21 1422 85     Resp 05/23/21 1422 16     Temp 05/23/21 1422 97.6 F (36.4 C)     Temp Source 05/23/21 1422 Oral     SpO2 05/23/21 1422 99 %     Weight 05/23/21 1438 135 lb (61.2 kg)     Height 05/23/21 1438 5\' 7"  (1.702 m)     Head Circumference --      Peak Flow --      Pain Score 05/23/21 1438 0     Pain Loc --      Pain Edu? --      Excl. in [place]? --     Most recent vital signs: Vitals:   05/23/21 1422 05/23/21 1820  BP: 115/79 112/74  Pulse: 85 80  Resp: 16 18  Temp: 97.6 F (36.4 C) 98 F (36.7 C)  SpO2: 99% 99%    General: Awake, NAD.  CV: Good peripheral perfusion.  Resp: Normal effort.  Abd: Soft, non-tender. No distention.  Neuro: At baseline  Other: Significant swelling in the tonsils with exudates.  Mild erythema.  Uvula midline.   Physical Exam    ED Results / Procedures / Treatments  Labs (all labs ordered are listed, but only abnormal results are displayed) Labs Reviewed  CBC WITH  DIFFERENTIAL/PLATELET - Abnormal; Notable for the following components:      Result Value   HCT 35.7 (*)    All other components within normal limits  BASIC METABOLIC PANEL - Abnormal; Notable for the following components:   Glucose, Bld 110 (*)    All other components within normal limits  RESP PANEL BY RT-PCR (FLU A&B, COVID) ARPGX2  GROUP A STREP BY PCR  HCG, QUANTITATIVE, PREGNANCY  POC URINE PREG, ED     EKG Not applicable.   RADIOLOGY  ED Provider Interpretation: Not applicable.  No results found.  PROCEDURES:  Critical Care performed: None.  Procedures    MEDICATIONS ORDERED IN ED: Medications  ondansetron (ZOFRAN-ODT) disintegrating tablet 4 mg (4 mg Oral Given 05/23/21 1440)  lactated ringers bolus 1,000 mL (0 mLs Intravenous Stopped 05/23/21 1820)  dexamethasone (DECADRON) injection 10 mg (10 mg Intravenous Given 05/23/21 1703)     IMPRESSION / MDM / ASSESSMENT AND PLAN / ED COURSE  I reviewed the triage vital signs and the nursing notes.  [name] is a 21 y.o. female, no remarkable medical history, presents the emergency department for evaluation of weakness and nausea.  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  Reports ongoing nausea, decreased appetite, and feelings of fatigue.  She states that she feels like she needs IV fluids.  She additionally endorses sore throat.  She states that her friend is being treated for tonsillitis right now and believes that she may also have tonsillitis  Differential diagnosis includes, but is not limited to, strep throat, tonsillitis, influenza, COVID-19, viral URI.  ED Course Respiratory panel negative for COVID-19 or influenza.  CBC unremarkable for leukocytosis or anemia.  BMP shows no electrolyte abnormalities or kidney injury.  Negative pregnancy test.  Will go ahead and treat the patient with ondansetron, lactated Ringer's, and  dexamethasone.  Group A strep negative.   Assessment/Plan Patient appears well on reexamination.  Her nausea has subsided.  Based on her presentation, I do not suspect any serious or life-threatening pathology.  Labs are reassuring.  Patient is negative for group A strep.  She likely has a viral tonsillitis.  Nausea likely due to dehydration.  We will plan to discharge this patient  Patient was provided with anticipatory guidance, return precautions, and educational material. Encouraged the patient to return to the emergency department at any time if they begin to experience any new or worsening symptoms.       FINAL CLINICAL IMPRESSION(S) / ED DIAGNOSES   Final diagnoses:  Dehydration  Tonsillitis     Rx / DC Orders   ED Discharge Orders     None        Note:  This document was prepared using Dragon voice recognition software and may include unintentional dictation errors.   Teodoro Spray, [place] 05/23/21 1911    Harvest Dark, MD 05/23/21 Joen Laura

## 2021-05-23 NOTE — ED Triage Notes (Signed)
Pt c/o nausea, weakness, and insomnia.  Reports she has had a stressful week.  Pt is a Consulting civil engineer at [employer].

## 2021-05-23 NOTE — ED Notes (Signed)
Ambulatory to bathroom. No signs of distress. VSS

## 2021-05-23 NOTE — ED Provider Triage Note (Signed)
°  Emergency Medicine Provider Triage Evaluation Note  Shannon Tran , a 21 y.o.female,  was evaluated in triage.  Pt complains of nausea and weakness.  Patient states that she has been extremely stressed this past week due to recent events and college.  Reports ongoing nausea, decreased appetite, and just feelings of fatigue.  Denies chest pain, shortness of breath, urinary symptoms, abdominal pain, back pain, or headaches.   Review of Systems  Positive: Weakness, nausea Negative: Denies fever, chest pain, vomiting  Physical Exam   Vitals:   05/23/21 1422  BP: 115/79  Pulse: 85  Resp: 16  Temp: 97.6 F (36.4 C)  SpO2: 99%   Gen:   Awake, no distress   Resp:  Normal effort  MSK:   Moves extremities without difficulty  Other:    Medical Decision Making  Given the patient's initial medical screening exam, the following diagnostic evaluation has been ordered. The patient will be placed in the appropriate treatment space, once one is available, to complete the evaluation and treatment. I have discussed the plan of care with the patient and I have advised the patient that an ED physician or mid-level practitioner will reevaluate their condition after the test results have been received, as the results may give them additional insight into the type of treatment they may need.    Diagnostics: Labs, respiratory panel  Treatments: Ondansetron.   Varney Daily, [place] 05/23/21 1427

## 2021-05-23 NOTE — ED Notes (Signed)
First encounter with pt prior to discharge. Pt verbalized understanding of discharge instructions. Pt advised if symptoms worsen to return to ED.

## 2021-05-23 NOTE — Discharge Instructions (Addendum)
-  Follow-up with your primary care provider as needed if symptoms persist. -Return to the emergency department anytime if you begin to experience any new or worsening symptoms.

## 2021-05-23 NOTE — ED Triage Notes (Signed)
Pt via POV from home. Pt states she felt like she over worked herself. Pt c/o weakness and nausea. Denies pain. Pt is A&Ox4 and NAD
